# Patient Record
Sex: Male | Born: 1955 | Hispanic: No | Marital: Married | Smoking: Never smoker
Health system: Southern US, Community
[De-identification: ages and names within clinical notes are randomized; demographics above are authoritative.]

## PROBLEM LIST (undated history)

## (undated) DIAGNOSIS — C61 Malignant neoplasm of prostate: Secondary | ICD-10-CM

## (undated) DIAGNOSIS — E119 Type 2 diabetes mellitus without complications: Secondary | ICD-10-CM

## (undated) DIAGNOSIS — I219 Acute myocardial infarction, unspecified: Secondary | ICD-10-CM

## (undated) DIAGNOSIS — I2510 Atherosclerotic heart disease of native coronary artery without angina pectoris: Secondary | ICD-10-CM

## (undated) DIAGNOSIS — I10 Essential (primary) hypertension: Secondary | ICD-10-CM

## (undated) DIAGNOSIS — C801 Malignant (primary) neoplasm, unspecified: Secondary | ICD-10-CM

## (undated) HISTORY — PX: BYPASS GRAFT: SHX909

## (undated) HISTORY — PX: CARDIAC SURGERY: SHX584

## (undated) HISTORY — PX: CORONARY ANGIOPLASTY WITH STENT PLACEMENT: SHX49

---

## 2015-09-13 ENCOUNTER — Emergency Department (HOSPITAL_COMMUNITY): Payer: Medicare Other

## 2015-09-13 ENCOUNTER — Encounter (HOSPITAL_COMMUNITY): Payer: Self-pay

## 2015-09-13 ENCOUNTER — Emergency Department (HOSPITAL_COMMUNITY)
Admission: EM | Admit: 2015-09-13 | Discharge: 2015-09-13 | Payer: Medicare Other | Attending: Emergency Medicine | Admitting: Emergency Medicine

## 2015-09-13 DIAGNOSIS — Z7982 Long term (current) use of aspirin: Secondary | ICD-10-CM | POA: Diagnosis not present

## 2015-09-13 DIAGNOSIS — Z7984 Long term (current) use of oral hypoglycemic drugs: Secondary | ICD-10-CM | POA: Insufficient documentation

## 2015-09-13 DIAGNOSIS — L03116 Cellulitis of left lower limb: Secondary | ICD-10-CM | POA: Diagnosis present

## 2015-09-13 DIAGNOSIS — Z79899 Other long term (current) drug therapy: Secondary | ICD-10-CM | POA: Diagnosis not present

## 2015-09-13 DIAGNOSIS — M719 Bursopathy, unspecified: Secondary | ICD-10-CM | POA: Insufficient documentation

## 2015-09-13 DIAGNOSIS — W57XXXA Bitten or stung by nonvenomous insect and other nonvenomous arthropods, initial encounter: Secondary | ICD-10-CM | POA: Diagnosis not present

## 2015-09-13 DIAGNOSIS — I252 Old myocardial infarction: Secondary | ICD-10-CM | POA: Diagnosis not present

## 2015-09-13 DIAGNOSIS — Y939 Activity, unspecified: Secondary | ICD-10-CM | POA: Insufficient documentation

## 2015-09-13 DIAGNOSIS — Y999 Unspecified external cause status: Secondary | ICD-10-CM | POA: Insufficient documentation

## 2015-09-13 DIAGNOSIS — I10 Essential (primary) hypertension: Secondary | ICD-10-CM | POA: Insufficient documentation

## 2015-09-13 DIAGNOSIS — Y929 Unspecified place or not applicable: Secondary | ICD-10-CM | POA: Insufficient documentation

## 2015-09-13 DIAGNOSIS — Z8546 Personal history of malignant neoplasm of prostate: Secondary | ICD-10-CM | POA: Diagnosis not present

## 2015-09-13 DIAGNOSIS — M7110 Other infective bursitis, unspecified site: Secondary | ICD-10-CM

## 2015-09-13 DIAGNOSIS — Z859 Personal history of malignant neoplasm, unspecified: Secondary | ICD-10-CM | POA: Insufficient documentation

## 2015-09-13 DIAGNOSIS — E119 Type 2 diabetes mellitus without complications: Secondary | ICD-10-CM | POA: Diagnosis not present

## 2015-09-13 DIAGNOSIS — I2510 Atherosclerotic heart disease of native coronary artery without angina pectoris: Secondary | ICD-10-CM | POA: Diagnosis not present

## 2015-09-13 DIAGNOSIS — Z95818 Presence of other cardiac implants and grafts: Secondary | ICD-10-CM | POA: Diagnosis not present

## 2015-09-13 DIAGNOSIS — S80262A Insect bite (nonvenomous), left knee, initial encounter: Secondary | ICD-10-CM | POA: Insufficient documentation

## 2015-09-13 HISTORY — DX: Atherosclerotic heart disease of native coronary artery without angina pectoris: I25.10

## 2015-09-13 HISTORY — DX: Malignant neoplasm of prostate: C61

## 2015-09-13 HISTORY — DX: Essential (primary) hypertension: I10

## 2015-09-13 HISTORY — DX: Malignant (primary) neoplasm, unspecified: C80.1

## 2015-09-13 HISTORY — DX: Type 2 diabetes mellitus without complications: E11.9

## 2015-09-13 HISTORY — DX: Acute myocardial infarction, unspecified: I21.9

## 2015-09-13 LAB — CBC WITH DIFFERENTIAL/PLATELET
BASOS ABS: 0 10*3/uL (ref 0.0–0.1)
Basophils Relative: 0 %
Eosinophils Absolute: 0.1 10*3/uL (ref 0.0–0.7)
Eosinophils Relative: 0 %
HCT: 42.1 % (ref 39.0–52.0)
Hemoglobin: 13.6 g/dL (ref 13.0–17.0)
LYMPHS ABS: 1.8 10*3/uL (ref 0.7–4.0)
LYMPHS PCT: 14 %
MCH: 28.4 pg (ref 26.0–34.0)
MCHC: 32.3 g/dL (ref 30.0–36.0)
MCV: 87.9 fL (ref 78.0–100.0)
MONOS PCT: 7 %
Monocytes Absolute: 1 10*3/uL (ref 0.1–1.0)
NEUTROS ABS: 10.4 10*3/uL — AB (ref 1.7–7.7)
NEUTROS PCT: 79 %
PLATELETS: 135 10*3/uL — AB (ref 150–400)
RBC: 4.79 MIL/uL (ref 4.22–5.81)
RDW: 15.6 % — AB (ref 11.5–15.5)
WBC: 13.2 10*3/uL — AB (ref 4.0–10.5)

## 2015-09-13 LAB — I-STAT CG4 LACTIC ACID, ED: LACTIC ACID, VENOUS: 1.56 mmol/L (ref 0.5–1.9)

## 2015-09-13 LAB — URINALYSIS, ROUTINE W REFLEX MICROSCOPIC
Bilirubin Urine: NEGATIVE
GLUCOSE, UA: NEGATIVE mg/dL
Hgb urine dipstick: NEGATIVE
KETONES UR: NEGATIVE mg/dL
LEUKOCYTES UA: NEGATIVE
NITRITE: NEGATIVE
PH: 5.5 (ref 5.0–8.0)
Protein, ur: NEGATIVE mg/dL
SPECIFIC GRAVITY, URINE: 1.013 (ref 1.005–1.030)

## 2015-09-13 LAB — COMPREHENSIVE METABOLIC PANEL
ALT: 19 U/L (ref 17–63)
AST: 18 U/L (ref 15–41)
Albumin: 3.7 g/dL (ref 3.5–5.0)
Alkaline Phosphatase: 54 U/L (ref 38–126)
Anion gap: 9 (ref 5–15)
BUN: 18 mg/dL (ref 6–20)
CHLORIDE: 106 mmol/L (ref 101–111)
CO2: 24 mmol/L (ref 22–32)
CREATININE: 1.11 mg/dL (ref 0.61–1.24)
Calcium: 8.8 mg/dL — ABNORMAL LOW (ref 8.9–10.3)
Glucose, Bld: 137 mg/dL — ABNORMAL HIGH (ref 65–99)
POTASSIUM: 3.6 mmol/L (ref 3.5–5.1)
SODIUM: 139 mmol/L (ref 135–145)
Total Bilirubin: 1.2 mg/dL (ref 0.3–1.2)
Total Protein: 7.4 g/dL (ref 6.5–8.1)

## 2015-09-13 MED ORDER — ACETAMINOPHEN 325 MG PO TABS
650.0000 mg | ORAL_TABLET | Freq: Once | ORAL | Status: AC
  Administered 2015-09-13: 650 mg via ORAL
  Filled 2015-09-13: qty 2

## 2015-09-13 MED ORDER — SODIUM CHLORIDE 0.9 % IV BOLUS (SEPSIS)
1000.0000 mL | Freq: Once | INTRAVENOUS | Status: AC
  Administered 2015-09-13: 1000 mL via INTRAVENOUS

## 2015-09-13 MED ORDER — VANCOMYCIN HCL 10 G IV SOLR
1500.0000 mg | INTRAVENOUS | Status: AC
  Administered 2015-09-13: 1500 mg via INTRAVENOUS
  Filled 2015-09-13: qty 1500

## 2015-09-13 MED ORDER — VANCOMYCIN HCL IN DEXTROSE 1-5 GM/200ML-% IV SOLN: 1000.0000 mg | Freq: Two times a day (BID) | INTRAVENOUS | Status: DC

## 2015-09-13 MED ORDER — PIPERACILLIN-TAZOBACTAM 3.375 G IVPB
3.3750 g | Freq: Once | INTRAVENOUS | Status: AC
  Administered 2015-09-13: 3.375 g via INTRAVENOUS
  Filled 2015-09-13: qty 50

## 2015-09-13 NOTE — Discharge Instructions (Signed)
Present directly to the [place], [place] Emergency Department at [facility] for further evaluation.

## 2015-09-13 NOTE — ED Notes (Signed)
Patient aware that urine sample is needed, urinal is at the bedside.

## 2015-09-13 NOTE — ED Notes (Signed)
Pt c/o increasing insect bite and cellulitis on L knee x 2 days.  Pain score 5/10.  Pt reports being sent by an Urgent Care.  Pt's wife is concerned because MD at Urgent Care told him that he has an irregular heartbeat.  Pt denying cardiac complaints.  Pt was given ODT Zofran by Urgent Care.

## 2015-09-13 NOTE — Progress Notes (Addendum)
Pharmacy Antibiotic Note  Juan Porter is a 59 y.o. male with PMH CAD s/p CABG 2011 admitted on 09/13/2015 with cellulitis 3 days following bug bite. Lesion is draining, although unspecified as to whether drainage is purulent. Seen earlier today by urgent care, but no abx prior to now. Pharmacy has been consulted for vancomycin dosing. Zosyn x 1 in ED but not ordered further  Plan: Vancomycin 1500 mg IV now, then 1000 mg IV q12 hr; goal trough 10-15 mcg/mL Measure vancomycin trough levels at steady state as indicated   Height: 5\' 7"  (170.2 cm) Weight: 195 lb (88.451 kg) IBW/kg (Calculated) : 66.1  Temp (24hrs), Avg:99.1 F (37.3 C), Min:99.1 F (37.3 C), Max:99.1 F (37.3 C)  No results for input(s): WBC, CREATININE, LATICACIDVEN, VANCOTROUGH, VANCOPEAK, VANCORANDOM, GENTTROUGH, GENTPEAK, GENTRANDOM, TOBRATROUGH, TOBRAPEAK, TOBRARND, AMIKACINPEAK, AMIKACINTROU, AMIKACIN in the last 168 hours.  CrCl cannot be calculated (Patient has no serum creatinine result on file.).    Allergies not on file  Antimicrobials this admission: Zosyn x 1 vancomycin 6/30 >>   Dose adjustments this admission: ---  Microbiology results: 6/30 BCx: sent 6/30 UCx: pending collection    Thank you for allowing pharmacy to be a part of this patient's care.  Reuel Boom, PharmD, BCPS Pager: [phone] 09/13/2015, 1:41 PM

## 2015-09-13 NOTE — ED Notes (Signed)
Area of redness on left knee marked and immobilizer applied

## 2015-09-13 NOTE — Consult Note (Signed)
Pcp Not In System Chief Complaint: Cellulitis x 3 days of the left knee History: Juan Porter is a 60 y.o. male with PMH CAD s/p CABG 2011 admitted on 09/13/2015 with cellulitis 3 days following bug bite. Lesion was draining but has stopped, although unspecified as to whether drainage is purulent. Seen earlier today by urgent care, but no abx prior to now. Pharmacy has been consulted for vancomycin dosing. Zosyn x 1 in ED but not ordered further Past Medical History  Diagnosis Date  . Cancer ([place])   . Prostate cancer ([place])   . MI (myocardial infarction) ([place])   . Diabetes mellitus without complication ([place])   . Hypertension   . Coronary artery disease     Allergies  Allergen Reactions  . Percocet [Oxycodone-Acetaminophen] Hives and Itching    No current facility-administered medications on file prior to encounter.   No current outpatient prescriptions on file prior to encounter.    Physical Exam: Filed Vitals:   09/13/15 1348 09/13/15 1715  BP: 103/71 121/80  Pulse: 81 86  Temp: 98.3 F (36.8 C)   Resp: 23 20  A+O X3 2+ DP/PT pulses EHL/TA/GA intact Sensation intact No pain with AROM/PROM of hip/knee/ankle No drainage noted Erythema and tenderness to palpation No SOB/CP Abd soft/NT   Image: Dg Knee Complete 4 Views Left  09/13/2015  CLINICAL DATA:  Over the left anterior knee 5 days ago with erythema, swelling and pain. EXAM: LEFT KNEE - COMPLETE 4+ VIEW COMPARISON:  None. FINDINGS: Mild diffuse decreased bone mineralization. Mild osteoarthritic change of the medial compartment and patellofemoral joints. Surgical clips over the posterior medial soft tissues of the lower leg. No fracture or dislocation. No significant joint effusion. Mild soft tissue swelling over the prepatellar soft tissues. No air within the soft tissue. IMPRESSION: Soft tissue swelling over the prepatellar region. No air within the soft tissue. Mild osteoarthritis. Electronically Signed   By: Marin Olp M.D.   On: 09/13/2015 16:04    A/P:  Patient with swollen red pre-patellar bursitis. No clinical evidence of septic joint Patient expressed desire to return home for treatment. Discussed with patient and wife - will go directly to ER close to home tonight to set up definitive treatment Placed in knee immobilizer for comfort and IV ancef

## 2015-09-13 NOTE — ED Provider Notes (Signed)
CSN: [id number]     Arrival date & time 09/13/15  1145 History   First MD Initiated Contact with Patient 09/13/15 1304     Chief Complaint  Patient presents with  . Insect Bite  . Cellulitis     The history is provided by the patient. No language interpreter was used.   Juan Porter is a 60 y.o. male who presents to the Emergency Department complaining of insect bite.  He reports 3 days of pain and swelling to the left knee. He feels that he has a bite at that area. It has been draining and he's been squeezing the drainage out. Today he went to urgent care because the redness was getting worse and he has stiffness with moving his knee. He denies any fevers but has nausea and vomiting today. Urgent care noted a temperature of 101 and he was referred to the emergency department for further evaluation. Has a history of diabetes, hypertension, coronary artery disease. No prior history of skin infections.  Past Medical History  Diagnosis Date  . Cancer ([place])   . Prostate cancer ([place])   . MI (myocardial infarction) ([place])   . Diabetes mellitus without complication ([place])   . Hypertension   . Coronary artery disease    Past Surgical History  Procedure Laterality Date  . Bypass graft    . Cardiac surgery    . Coronary angioplasty with stent placement     History reviewed. No pertinent family history. Social History  Substance Use Topics  . Smoking status: Never Smoker   . Smokeless tobacco: Never Used  . Alcohol Use: No    Review of Systems  All other systems reviewed and are negative.     Allergies  Percocet  Home Medications   Prior to Admission medications   Medication Sig Start Date End Date Taking? Authorizing Provider  aspirin EC 81 MG tablet Take 81 mg by mouth daily with breakfast.   Yes Historical Provider, MD  cholecalciferol (VITAMIN D) 1000 units tablet Take 1,000 Units by mouth daily.   Yes Historical Provider, MD  clopidogrel (PLAVIX) 75 MG tablet Take 75 mg  by mouth every evening.   Yes Historical Provider, MD  gabapentin (NEURONTIN) 600 MG tablet Take 600 mg by mouth 3 (three) times daily.   Yes Historical Provider, MD  lisinopril (PRINIVIL,ZESTRIL) 2.5 MG tablet Take 2.5 mg by mouth daily.   Yes Historical Provider, MD  metFORMIN (GLUCOPHAGE) 500 MG tablet Take 500 mg by mouth daily.   Yes Historical Provider, MD  metoprolol succinate (TOPROL-XL) 25 MG 24 hr tablet Take 25 mg by mouth every evening.   Yes Historical Provider, MD  nitroGLYCERIN (NITROSTAT) 0.4 MG SL tablet Place 0.4 mg under the tongue every 5 (five) minutes as needed for chest pain.   Yes Historical Provider, MD  Omega-3 Fatty Acids (FISH OIL) 1000 MG CAPS Take 1,000 mg by mouth daily.   Yes Historical Provider, MD  omeprazole (PRILOSEC) 20 MG capsule Take 20 mg by mouth daily.   Yes Historical Provider, MD  simvastatin (ZOCOR) 40 MG tablet Take 40 mg by mouth every evening.   Yes Historical Provider, MD  traMADol (ULTRAM) 50 MG tablet Take 50 mg by mouth every 6 (six) hours as needed for moderate pain.   Yes Historical Provider, MD  vitamin B-12 (CYANOCOBALAMIN) 500 MCG tablet Take 500 mcg by mouth daily.   Yes Historical Provider, MD   BP 121/80 mmHg  Pulse 86  Temp(Src) 98.3 F (  36.8 C) (Oral)  Resp 20  Ht 5\' 7"  (1.702 m)  Wt 195 lb (88.451 kg)  BMI 30.53 kg/m2  SpO2 94% Physical Exam  Constitutional: He is oriented to person, place, and time. He appears well-developed and well-nourished.  HENT:  Head: Normocephalic and atraumatic.  Cardiovascular: Normal rate and regular rhythm.   No murmur heard. Pulmonary/Chest: Effort normal and breath sounds normal. No respiratory distress.  Abdominal: Soft. There is no tenderness. There is no rebound and no guarding.  Musculoskeletal:  Moderate erythema, induration over the left upper knee with focal draining region laterally. Flexion and extension are intact throughout the knee.  Neurological: He is alert and oriented to  person, place, and time.  Skin: Skin is warm and dry.  Psychiatric: He has a normal mood and affect. His behavior is normal.  Nursing note and vitals reviewed.   ED Course  Procedures (including critical care time) Labs Review Labs Reviewed  COMPREHENSIVE METABOLIC PANEL - Abnormal; Notable for the following:    Glucose, Bld 137 (*)    Calcium 8.8 (*)    All other components within normal limits  CBC WITH DIFFERENTIAL/PLATELET - Abnormal; Notable for the following:    WBC 13.2 (*)    RDW 15.6 (*)    Platelets 135 (*)    Neutro Abs 10.4 (*)    All other components within normal limits  CULTURE, BLOOD (ROUTINE X 2)  CULTURE, BLOOD (ROUTINE X 2)  URINE CULTURE  URINALYSIS, ROUTINE W REFLEX MICROSCOPIC (NOT AT [facility])  I-STAT CG4 LACTIC ACID, ED    Imaging Review Dg Knee Complete 4 Views Left  09/13/2015  CLINICAL DATA:  Over the left anterior knee 5 days ago with erythema, swelling and pain. EXAM: LEFT KNEE - COMPLETE 4+ VIEW COMPARISON:  None. FINDINGS: Mild diffuse decreased bone mineralization. Mild osteoarthritic change of the medial compartment and patellofemoral joints. Surgical clips over the posterior medial soft tissues of the lower leg. No fracture or dislocation. No significant joint effusion. Mild soft tissue swelling over the prepatellar soft tissues. No air within the soft tissue. IMPRESSION: Soft tissue swelling over the prepatellar region. No air within the soft tissue. Mild osteoarthritis. Electronically Signed   By: Marin Olp M.D.   On: 09/13/2015 16:04   I have personally reviewed and evaluated these images and lab results as part of my medical decision-making.   EKG Interpretation None      MDM   Final diagnoses:  Septic bursitis    Patient with increasing pain, swelling and fevers. No evidence of septic joint based on examination but there is concern for septic bursitis with local cellulitis. Patient does not want admitted to our facility as he lives in  [place]. He wants to leave the emergency department here and go to [facility]. He does not want EMS transport and prefers to go by private vehicle. Discussed with patient there are significant risks due to travel by personal vehicle and patient acknowledges these risks. Discussed the case with Dr. service in the [facility] emergency department who accepts in transfer. Dr. Rolena Infante with orthopedics has evaluated the patient in the emergency department. Plan to place a knee immobilizer with follow-up in the [facility] emergency department.  Quintella Reichert, MD 09/13/15 2337

## 2015-09-14 LAB — URINE CULTURE: CULTURE: NO GROWTH

## 2015-09-18 LAB — CULTURE, BLOOD (ROUTINE X 2)
CULTURE: NO GROWTH
CULTURE: NO GROWTH

## 2017-01-06 IMAGING — CR DG KNEE COMPLETE 4+V*L*
4 series · 4 of 4 positions shown · non-contrast
Comparison: None.

CLINICAL DATA: Over the left anterior knee 5 days ago with
erythema, swelling and pain.

EXAM:
LEFT KNEE - COMPLETE 4+ VIEW

[x knee ap left (1 of 3)]
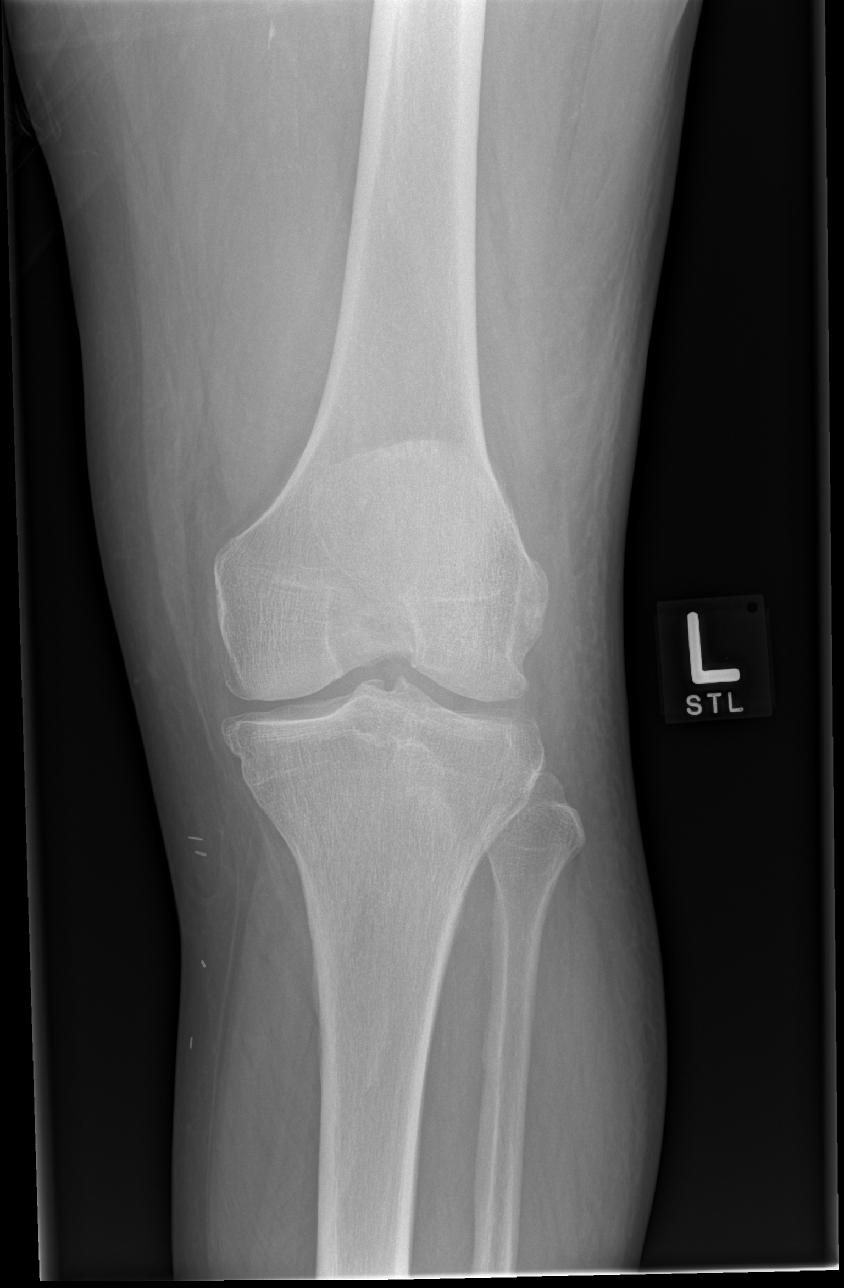

[x knee ap left (2 of 3)]
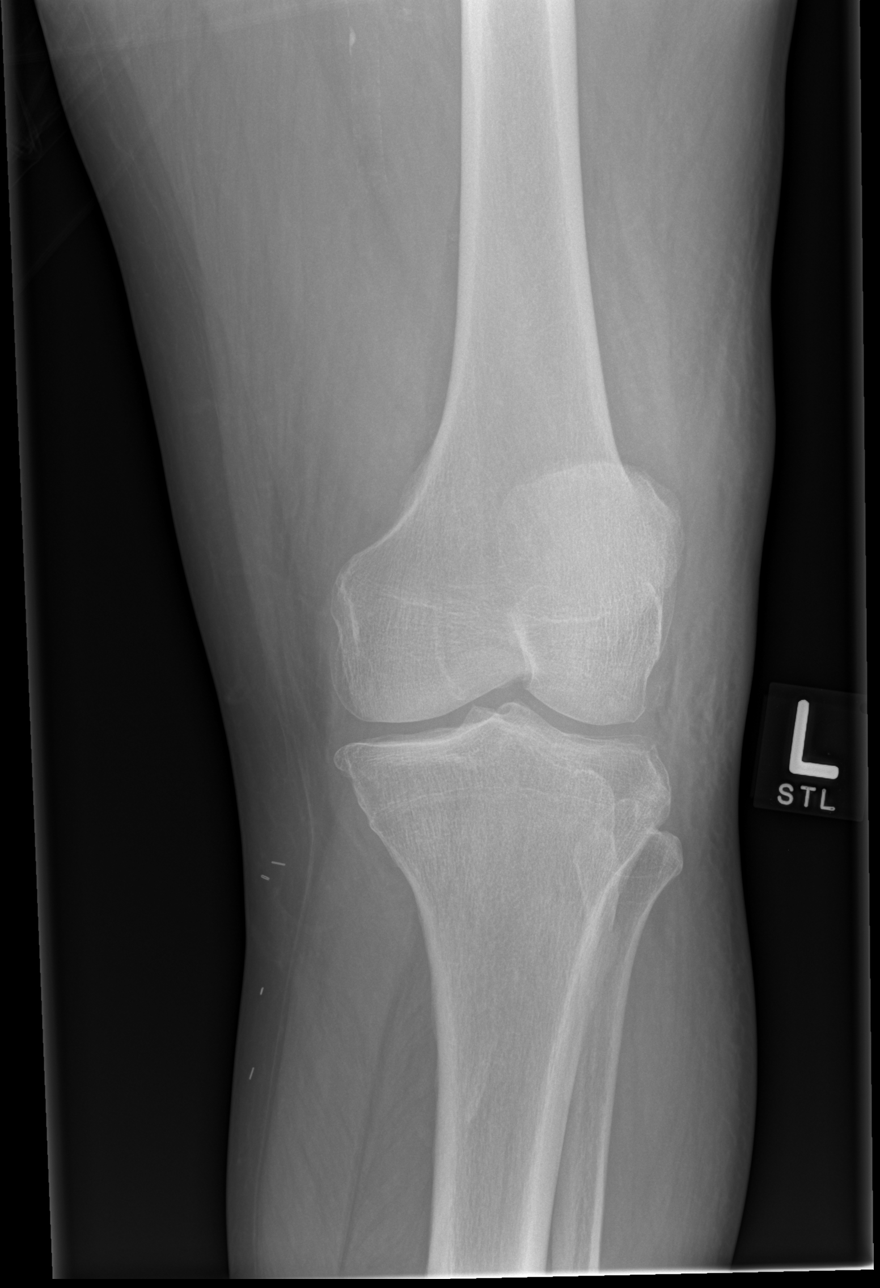

[x knee ap left (3 of 3)]
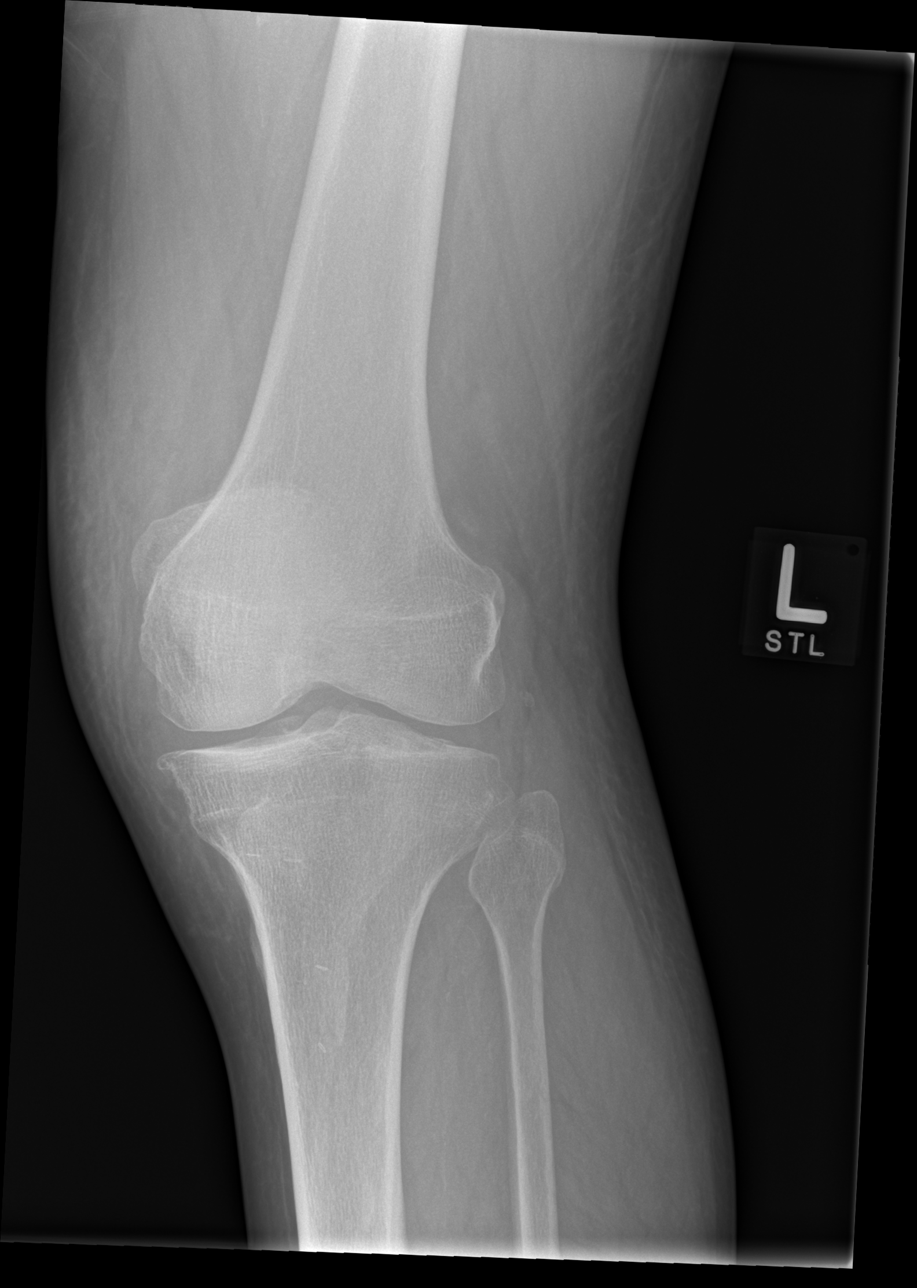

[x knee lat left]
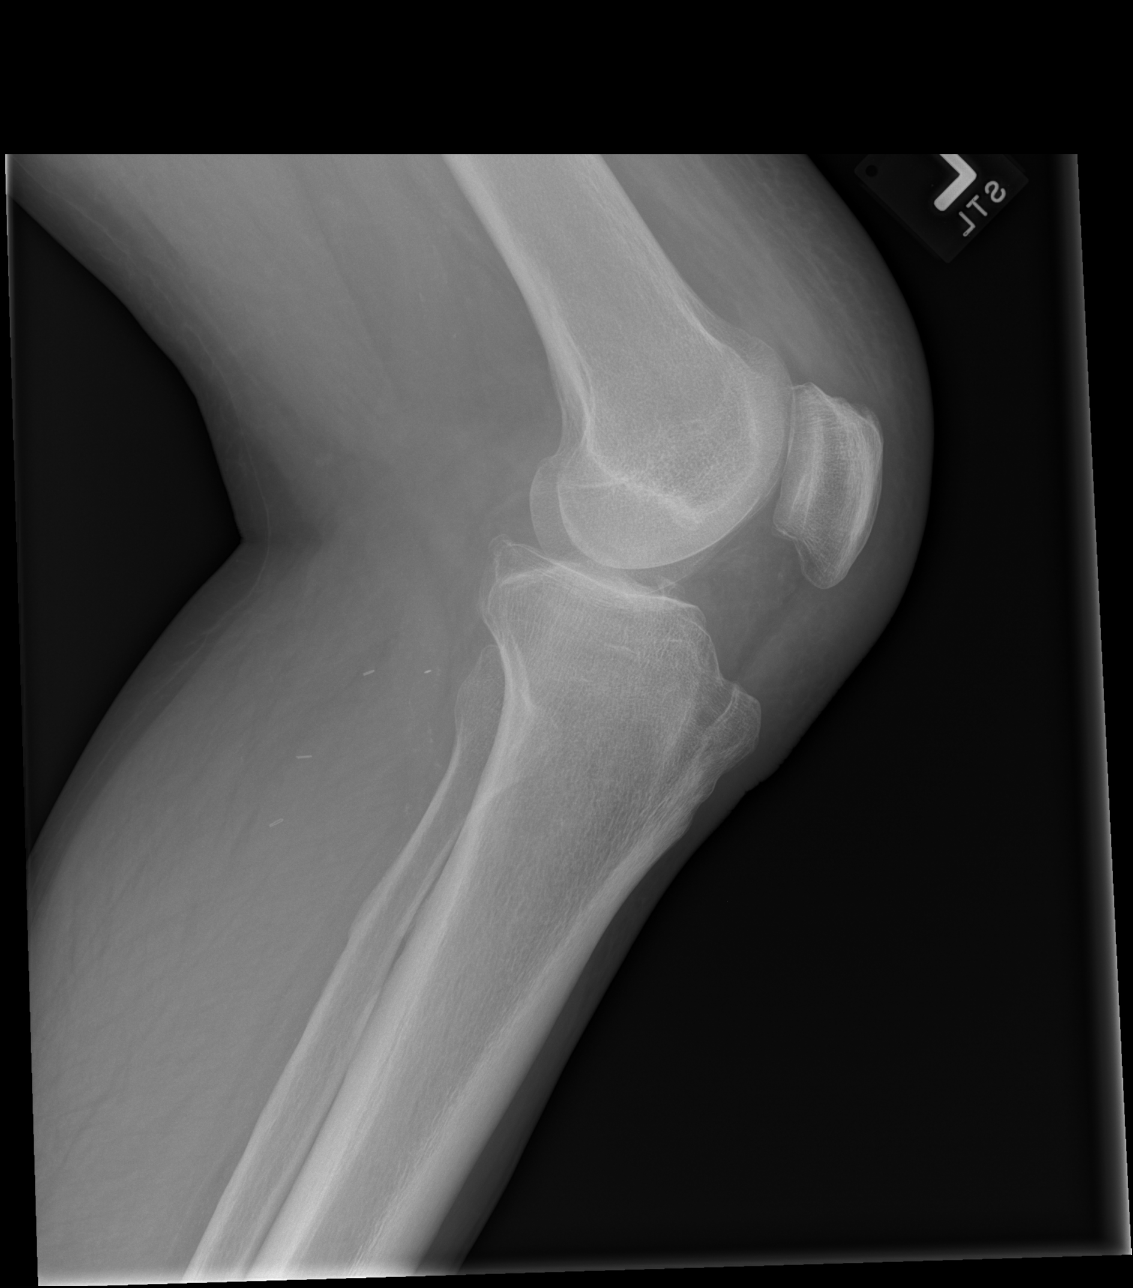

[4 of 4 positions shown; findings below may reference images not displayed]

FINDINGS: Mild diffuse decreased bone mineralization. Mild osteoarthritic
change of the medial compartment and patellofemoral joints. Surgical
clips over the posterior medial soft tissues of the lower leg. No
fracture or dislocation. No significant joint effusion. Mild soft
tissue swelling over the prepatellar soft tissues. No air within the
soft tissue.
IMPRESSION: Soft tissue swelling over the prepatellar region. No air within the
soft tissue.

Mild osteoarthritis.
